# Patient Record
Sex: Female | Born: 1975 | Race: Black or African American | Hispanic: No | Marital: Single | Smoking: Never smoker
Health system: Southern US, Community
[De-identification: ages and names within clinical notes are randomized; demographics above are authoritative.]

## PROBLEM LIST (undated history)

## (undated) HISTORY — PX: GASTRIC BYPASS: SHX52

---

## 2020-07-18 ENCOUNTER — Emergency Department (HOSPITAL_COMMUNITY)
Admission: EM | Admit: 2020-07-18 | Discharge: 2020-07-18 | Disposition: A | Discharge status: Home / Self Care | Payer: Self-pay | Attending: Emergency Medicine | Admitting: Emergency Medicine

## 2020-07-18 ENCOUNTER — Emergency Department (HOSPITAL_COMMUNITY): Payer: Self-pay

## 2020-07-18 ENCOUNTER — Other Ambulatory Visit: Payer: Self-pay

## 2020-07-18 ENCOUNTER — Encounter (HOSPITAL_COMMUNITY): Payer: Self-pay

## 2020-07-18 DIAGNOSIS — K118 Other diseases of salivary glands: Secondary | ICD-10-CM

## 2020-07-18 DIAGNOSIS — R221 Localized swelling, mass and lump, neck: Secondary | ICD-10-CM | POA: Insufficient documentation

## 2020-07-18 DIAGNOSIS — D649 Anemia, unspecified: Secondary | ICD-10-CM

## 2020-07-18 LAB — CBC WITH DIFFERENTIAL/PLATELET
Abs Immature Granulocytes: 0.01 10*3/uL (ref 0.00–0.07)
Basophils Absolute: 0 10*3/uL (ref 0.0–0.1)
Basophils Relative: 1 %
Eosinophils Absolute: 0.1 10*3/uL (ref 0.0–0.5)
Eosinophils Relative: 2 %
HCT: 27.3 % — ABNORMAL LOW (ref 36.0–46.0)
Hemoglobin: 7.4 g/dL — ABNORMAL LOW (ref 12.0–15.0)
Immature Granulocytes: 0 %
Lymphocytes Relative: 38 %
Lymphs Abs: 1.8 10*3/uL (ref 0.7–4.0)
MCH: 18.1 pg — ABNORMAL LOW (ref 26.0–34.0)
MCHC: 27.1 g/dL — ABNORMAL LOW (ref 30.0–36.0)
MCV: 66.9 fL — ABNORMAL LOW (ref 80.0–100.0)
Monocytes Absolute: 0.4 10*3/uL (ref 0.1–1.0)
Monocytes Relative: 8 %
Neutro Abs: 2.4 10*3/uL (ref 1.7–7.7)
Neutrophils Relative %: 51 %
Platelets: 289 10*3/uL (ref 150–400)
RBC: 4.08 MIL/uL (ref 3.87–5.11)
RDW: 19.5 % — ABNORMAL HIGH (ref 11.5–15.5)
WBC: 4.7 10*3/uL (ref 4.0–10.5)
nRBC: 0 % (ref 0.0–0.2)

## 2020-07-18 LAB — BASIC METABOLIC PANEL
Anion gap: 5 (ref 5–15)
BUN: 10 mg/dL (ref 6–20)
CO2: 24 mmol/L (ref 22–32)
Calcium: 8.5 mg/dL — ABNORMAL LOW (ref 8.9–10.3)
Chloride: 107 mmol/L (ref 98–111)
Creatinine, Ser: 0.43 mg/dL — ABNORMAL LOW (ref 0.44–1.00)
GFR, Estimated: >60 mL/min (ref >60)
Glucose, Bld: 93 mg/dL (ref 70–99)
Potassium: 3.8 mmol/L (ref 3.5–5.1)
Sodium: 136 mmol/L (ref 135–145)

## 2020-07-18 LAB — TSH: TSH: 0.837 u[IU]/mL (ref 0.350–4.500)

## 2020-07-18 MED ORDER — FERROUS SULFATE 325 (65 FE) MG PO TABS: 325.0000 mg | ORAL_TABLET | Freq: Every day | ORAL | 0 refills | Status: AC

## 2020-07-18 NOTE — ED Provider Notes (Signed)
  Physical Exam  BP 131/76   Pulse 70   Temp 98.3 F (36.8 C) (Oral)   Resp 18   Ht 5\' 3"  (1.6 m)   Wt 120.1 kg   LMP 07/04/2020 (Approximate)   SpO2 99%   BMI 46.91 kg/m   Physical Exam  ED Course/Procedures     Procedures  MDM  Care assumed at 4 PM.  Patient has a swollen area in the neck.  Signout pending ultrasound of the thyroid and TSH and basic blood work  8:20 PM CBC showed hemoglobin 7.4.  Patient states that she has history of anemia has not been checked recently.  Denies any heavy menstrual bleeding or rectal bleeding.  Ultrasound did not show any thyroid mass but just enlarged lymph nodes.  I examined her tongue and I do not see any obvious tongue mass.  Patient does not have any obvious periapical abscess or tooth infection.  At this point, I recommend that she follow-up with ENT for biopsy of the submandibular mass.     09/03/2020, MD 07/18/20 2021

## 2020-07-18 NOTE — Discharge Instructions (Addendum)
You have a mass of your neck.  You can either go to Dr. Lucky Rathke clinic to get a biopsy or call [phone] to be seen in the ENT clinic.  You have anemia and that needs to be rechecked in the week.  Take iron pill as prescribed  Return to ER if you have worse neck swelling, trouble swallowing, tongue swelling.

## 2020-07-18 NOTE — ED Provider Notes (Signed)
[facility]-EMERGENCY DEPT Provider Note   CSN: [id number] Arrival date & time: 07/18/20  1513     History Chief Complaint  Patient presents with  . swollen area    April Sharp is a 45 y.o. female.  Presents to ER with concern for swelling on her neck.  Patient reports that she does not have a primary care doctor, does not see physician regularly.  States that just today she noted mild swelling on the central part of her anterior neck under her mouth reports this is painful, does not have any associated fever or chills.  No redness.  She denies any personal history of cancer, denies any chronic medical problems.  Has not taken any medication for this condition.  HPI     History reviewed. No pertinent past medical history.  There are no problems to display for this patient.   Past Surgical History:  Procedure Laterality Date  . GASTRIC BYPASS       OB History   No obstetric history on file.     Family History  Problem Relation Age of Onset  . Cirrhosis Father     Social History   Tobacco Use  . Smoking status: Never Smoker  . Smokeless tobacco: Never Used  Vaping Use  . Vaping Use: Never used  Substance Use Topics  . Alcohol use: Never  . Drug use: Never    Home Medications Prior to Admission medications   Not on File    Allergies    Patient has no known allergies.  Review of Systems   Review of Systems  Constitutional: Negative for chills and fever.  HENT: Negative for ear pain and sore throat.   Eyes: Negative for pain and visual disturbance.  Respiratory: Negative for cough and shortness of breath.   Cardiovascular: Negative for chest pain and palpitations.  Gastrointestinal: Negative for abdominal pain and vomiting.  Genitourinary: Negative for dysuria and hematuria.  Musculoskeletal: Negative for arthralgias, back pain and neck pain.  Skin: Negative for color change and rash.  Neurological: Negative for seizures and  syncope.  All other systems reviewed and are negative.   Physical Exam Updated Vital Signs BP 133/72 (BP Location: Left Arm)   Pulse 79   Temp 98.3 F (36.8 C) (Oral)   Resp 16   Ht 5\' 3"  (1.6 m)   Wt 120.1 kg   LMP 07/04/2020 (Approximate)   SpO2 100%   BMI 46.91 kg/m   Physical Exam Vitals and nursing note reviewed.  Constitutional:      General: She is not in acute distress.    Appearance: She is well-developed.  HENT:     Head: Normocephalic and atraumatic.     Mouth/Throat:     Comments: There is slightly firm 1-2cm diameter area of swelling in the submandibular space over the anterior neck, no overlying erythema, no fluctuance or induration on skin Eyes:     Conjunctiva/sclera: Conjunctivae normal.  Cardiovascular:     Rate and Rhythm: Normal rate.     Pulses: Normal pulses.  Pulmonary:     Effort: Pulmonary effort is normal. No respiratory distress.  Musculoskeletal:     Cervical back: Neck supple.  Skin:    General: Skin is warm and dry.  Neurological:     Mental Status: She is alert.  Psychiatric:        Mood and Affect: Mood normal.        Behavior: Behavior normal.     ED Results /  Procedures / Treatments   Labs (all labs ordered are listed, but only abnormal results are displayed) Labs Reviewed  CBC WITH DIFFERENTIAL/PLATELET  BASIC METABOLIC PANEL  TSH    EKG None  Radiology No results found.  Procedures Procedures   Medications Ordered in ED Medications - No data to display  ED Course  I have reviewed the triage vital signs and the nursing notes.  Pertinent labs & imaging results that were available during my care of the patient were reviewed by me and considered in my medical decision making (see chart for details).    MDM Rules/Calculators/A&P                          45 year old lady presents to ER with concern for neck swelling.  On exam she had a small area of swelling just inferior to her submandibular space over her  anterior neck.  No erythema, fluctuance or induration to suggest infection.  May be a lymph node.  Will check basic labs and thyroid ultrasound.  At time of signout, work-up pending.  Dr. Silverio Lay will follow up on results.   Final Clinical Impression(s) / ED Diagnoses Final diagnoses:  Neck swelling    Rx / DC Orders ED Discharge Orders    None       Milagros Loll, MD 07/19/20 2135

## 2020-07-18 NOTE — ED Triage Notes (Signed)
Patient noted a swollen area under her chin yesterday. Patient denies any swallowing or breathing difficulties

## 2020-07-18 NOTE — ED Notes (Signed)
An After Visit Summary was printed and given to the patient. Discharge instructions given and no further questions at this time.  

## 2020-11-14 ENCOUNTER — Other Ambulatory Visit: Payer: Self-pay

## 2020-11-14 ENCOUNTER — Encounter (HOSPITAL_COMMUNITY): Payer: Self-pay

## 2020-11-14 ENCOUNTER — Emergency Department (HOSPITAL_COMMUNITY)
Admission: EM | Admit: 2020-11-14 | Discharge: 2020-11-14 | Disposition: A | Discharge status: Home / Self Care | Payer: Self-pay | Attending: Emergency Medicine | Admitting: Emergency Medicine

## 2020-11-14 DIAGNOSIS — U071 COVID-19: Secondary | ICD-10-CM | POA: Insufficient documentation

## 2020-11-14 DIAGNOSIS — J069 Acute upper respiratory infection, unspecified: Secondary | ICD-10-CM

## 2020-11-14 LAB — RESP PANEL BY RT-PCR (FLU A&B, COVID) ARPGX2
Influenza A by PCR: NEGATIVE
Influenza B by PCR: NEGATIVE
SARS Coronavirus 2 by RT PCR: POSITIVE — AB

## 2020-11-14 MED ORDER — ACETAMINOPHEN 325 MG PO TABS
650.0000 mg | ORAL_TABLET | Freq: Once | ORAL | Status: AC
  Administered 2020-11-14: 650 mg via ORAL
  Filled 2020-11-14: qty 2

## 2020-11-14 NOTE — ED Triage Notes (Signed)
Pt arrived via POV, c/o chills, nasal congestion, sore throat, and cough since last night. No known sick contacts.

## 2020-11-14 NOTE — Discharge Instructions (Addendum)
You were seen in the ER today for your congestion, cough, and fatigue. You likely have a viral upper respiratory infection.   You have been tested for COVID and the flu. You may follow these results in your mychart app.   You may use over the counter medications for your symptoms.   Return to the ER with any difficulty breathing, chest pain, nausea or vomitting that does not stop, or any other new severe symptoms.

## 2020-11-14 NOTE — ED Provider Notes (Signed)
[facility]-EMERGENCY DEPT Provider Note   CSN: [id number] Arrival date & time: 11/14/20  1247     History Chief Complaint  Patient presents with   Chills    April Sharp is a 45 y.o. female who presents with concern for 24 hours of chills, nasal congestion, sore throat, and dry cough since last night.  Patient is not vaccinated against COVID-19 denies any known sick contacts.  Denies any chest pain, shortness of breath, palpitations, or abdominal symptoms.  She denies any fevers at home.  I have personally reviewed this patient's medical records.  She is history of gastric bypass but is not on any medications every day.  HPI     History reviewed. No pertinent past medical history.  There are no problems to display for this patient.   Past Surgical History:  Procedure Laterality Date   GASTRIC BYPASS       OB History   No obstetric history on file.     Family History  Problem Relation Age of Onset   Cirrhosis Father     Social History   Tobacco Use   Smoking status: Never   Smokeless tobacco: Never  Vaping Use   Vaping Use: Never used  Substance Use Topics   Alcohol use: Never   Drug use: Never    Home Medications Prior to Admission medications   Medication Sig Start Date End Date Taking? Authorizing Provider  ferrous sulfate 325 (65 FE) MG tablet Take 1 tablet (325 mg total) by mouth daily. 07/18/20   Charlynne Pander, MD    Allergies    Patient has no known allergies.  Review of Systems   Review of Systems  Constitutional:  Positive for activity change, appetite change, chills and fatigue.  HENT:  Positive for congestion and sore throat. Negative for trouble swallowing and voice change.   Eyes: Negative.   Respiratory:  Positive for cough. Negative for shortness of breath.   Cardiovascular: Negative.   Gastrointestinal: Negative.   Genitourinary: Negative.   Musculoskeletal:  Positive for myalgias. Negative for arthralgias.   Skin: Negative.   Neurological:  Positive for headaches. Negative for dizziness, syncope, weakness and light-headedness.   Physical Exam Updated Vital Signs BP 118/72 (BP Location: Left Arm)   Pulse (!) 105   Temp 99.8 F (37.7 C) (Oral)   Resp 16   LMP 11/14/2020   SpO2 100%   Physical Exam Vitals and nursing note reviewed.  Constitutional:      Appearance: She is obese. She is not ill-appearing or toxic-appearing.  HENT:     Head: Normocephalic and atraumatic.     Nose: Congestion present.     Mouth/Throat:     Mouth: Mucous membranes are moist.     Pharynx: Oropharynx is clear. Uvula midline. No oropharyngeal exudate or posterior oropharyngeal erythema.     Tonsils: No tonsillar exudate.  Eyes:     General: Lids are normal. Vision grossly intact.        Right eye: No discharge.        Left eye: No discharge.     Extraocular Movements: Extraocular movements intact.     Conjunctiva/sclera: Conjunctivae normal.     Pupils: Pupils are equal, round, and reactive to light.  Neck:     Trachea: Trachea and phonation normal.     Meningeal: Brudzinski's sign and Kernig's sign absent.  Cardiovascular:     Rate and Rhythm: Normal rate and regular rhythm.     Pulses:  Normal pulses.     Heart sounds: Normal heart sounds. No murmur heard. Pulmonary:     Effort: Pulmonary effort is normal. No tachypnea, accessory muscle usage, prolonged expiration, respiratory distress or retractions.     Breath sounds: Normal breath sounds. No wheezing or rales.  Chest:     Chest wall: No mass, tenderness, crepitus or edema.  Abdominal:     General: Bowel sounds are normal. There is no distension.     Palpations: Abdomen is soft.     Tenderness: There is no abdominal tenderness. There is no right CVA tenderness, left CVA tenderness, guarding or rebound.  Musculoskeletal:        General: No deformity.     Cervical back: Normal range of motion and neck supple. No edema, rigidity or crepitus. No  pain with movement, spinous process tenderness or muscular tenderness.     Right lower leg: No edema.     Left lower leg: No edema.  Lymphadenopathy:     Cervical: Cervical adenopathy present.     Right cervical: Superficial cervical adenopathy present.  Skin:    General: Skin is warm and dry.     Capillary Refill: Capillary refill takes less than 2 seconds.     Findings: Rash present.  Neurological:     General: No focal deficit present.     Mental Status: She is alert and oriented to person, place, and time. Mental status is at baseline.     Gait: Gait is intact.  Psychiatric:        Mood and Affect: Mood normal.    ED Results / Procedures / Treatments   Labs (all labs ordered are listed, but only abnormal results are displayed) Labs Reviewed  RESP PANEL BY RT-PCR (FLU A&B, COVID) ARPGX2    EKG None  Radiology No results found.  Procedures Procedures   Medications Ordered in ED Medications - No data to display  ED Course  I have reviewed the triage vital signs and the nursing notes.  Pertinent labs & imaging results that were available during my care of the patient were reviewed by me and considered in my medical decision making (see chart for details).    MDM Rules/Calculators/A&P                         57 old female presents with 24 hours of chills, fatigue, congestion, and sore throat with dry cough.  Differential diagnosis is broad and includes but is not limited to COVID-19, influenza A/B, other acute viral URI, strep pharyngitis, other bacterial pharyngitis such as gonococcal pharyngitis, pneumonia, pleural effusion, PE, ACS.  Patient was very mildly tachycardic on intake to 105.  Vital signs were otherwise normal.  She did become febrile while in the emergency department with T-max in the ED of 101.4.  Cardiopulmonary exam is normal, abdominal exam is benign.  HEENT exam revealed nasal congestion but was otherwise unremarkable.  She does have some shotty  anterior cervical lymphadenopathy bilaterally but does not have any rashes and is neurovascularly intact in all 4 extremities.  Patient is ambulatory in the ED and tolerating p.o.  Given reassuring vital signs and physical exam, do not feel any further work-up is warranted in the ED at this time.  Patient does have respiratory panel pending at this time.  Concern for COVID-19 infection given lack of vaccination.  Directed patient to return to the emergency department or contact the health department or her primary care  doctor should she test positive for COVID-19 and desire antiviral treatment in the outpatient setting.  Patient did remain febrile at time of discharge 100.9, however was administered Tylenol.  Given reassuring vital signs otherwise, she remained stable for discharge at this time.  April Sharp voiced understanding of her medical evaluation and treatment plan.  Each of her questions was answered to her expressed satisfaction.  Return precautions are given.  Patient is well-appearing, stable, and appropriate for discharge at this time.  This chart was dictated using voice recognition software, Dragon. Despite the best efforts of this provider to proofread and correct errors, errors may still occur which can change documentation meaning.   April Sharp was evaluated in Emergency Department on 11/16/2020 for the symptoms described in the history of present illness. She was evaluated in the context of the global COVID-19 pandemic, which necessitated consideration that the patient might be at risk for infection with the SARS-CoV-2 virus that causes COVID-19. Institutional protocols and algorithms that pertain to the evaluation of patients at risk for COVID-19 are in a state of rapid change based on information released by regulatory bodies including the CDC and federal and state organizations. These policies and algorithms were followed during the patient's care in the ED.   Final Clinical Impression(s)  / ED Diagnoses Final diagnoses:  Upper respiratory tract infection, unspecified type    Rx / DC Orders ED Discharge Orders     None        Sherrilee Gilles 11/16/20 2214    Melene Plan, DO 11/16/20 2233

## 2021-12-03 IMAGING — US US THYROID
1 series · 13 of 25 positions shown · non-contrast
Comparison: None.

CLINICAL DATA: Submandibular swelling

EXAM:
THYROID ULTRASOUND
TECHNIQUE: Ultrasound examination of the thyroid gland and adjacent soft
tissues was performed.

[Series 1: us thyroid · 13 of 58 slices shown]
[im 1/58]
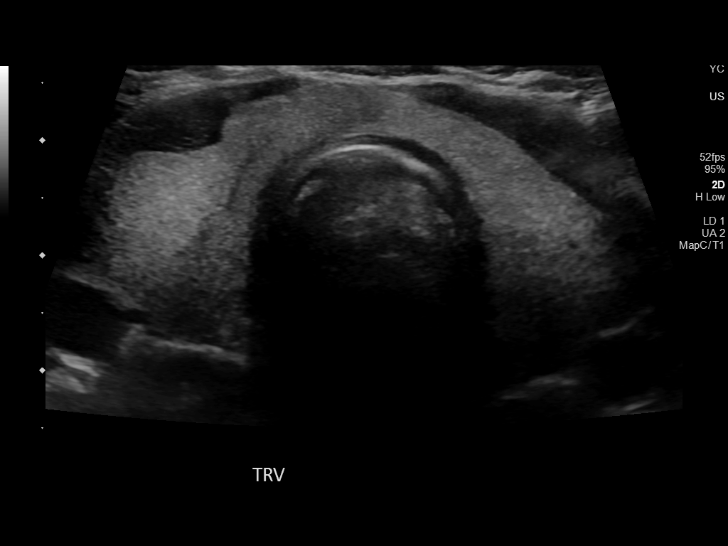
[im 5/58]
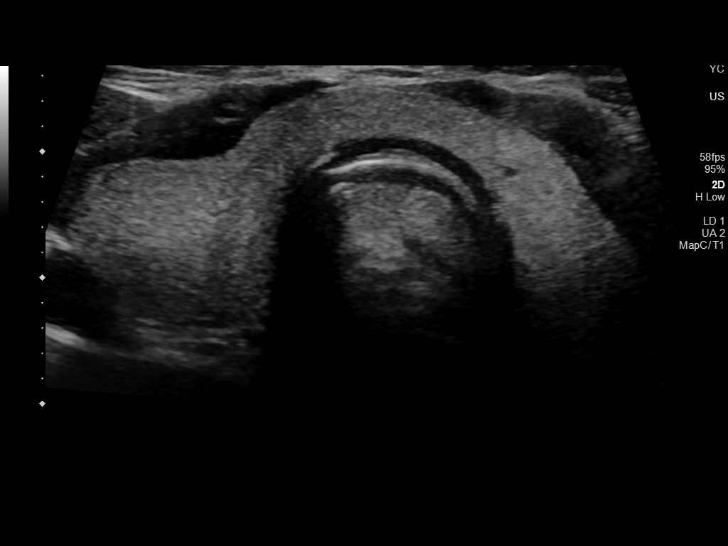
[im 10/58]
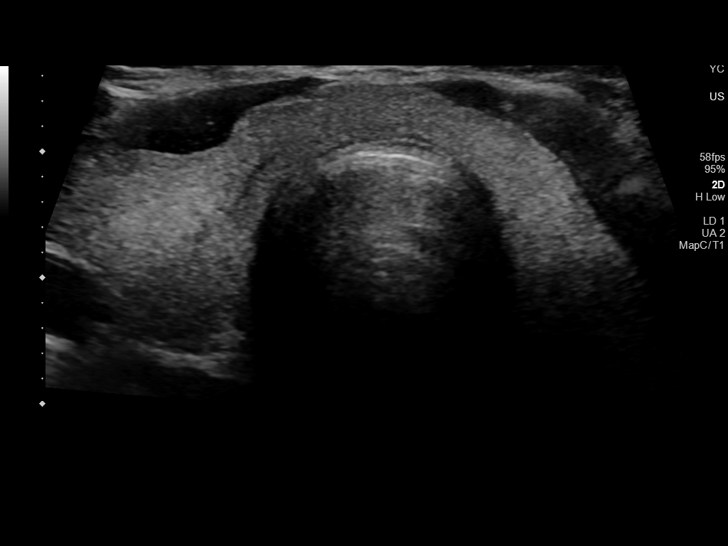
[im 15/58]
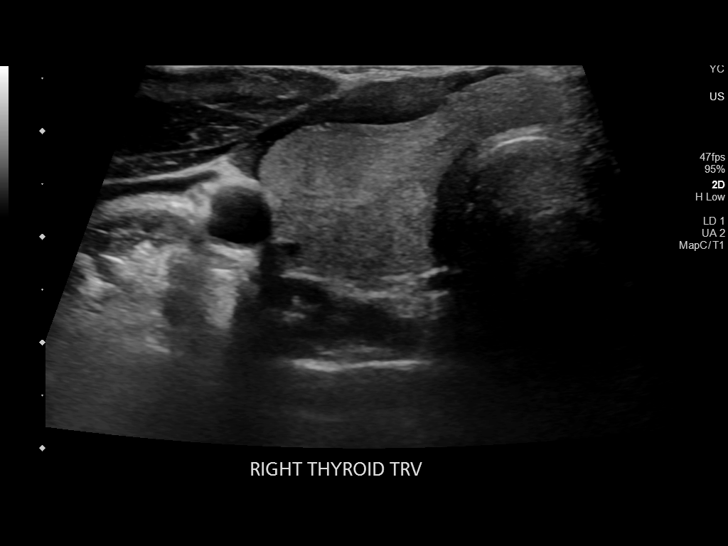
[im 20/58]
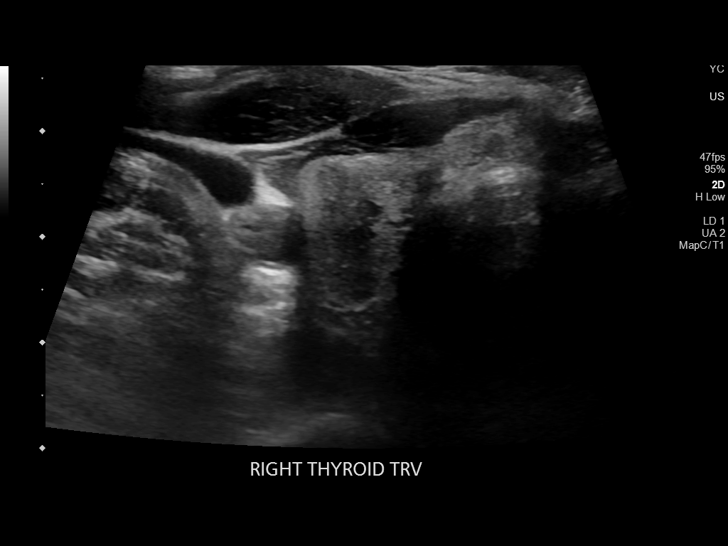
[im 24/58]
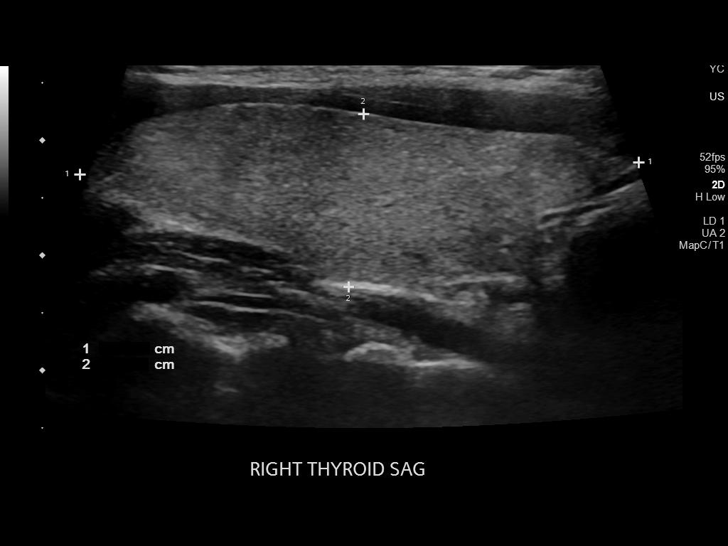
[im 29/58]
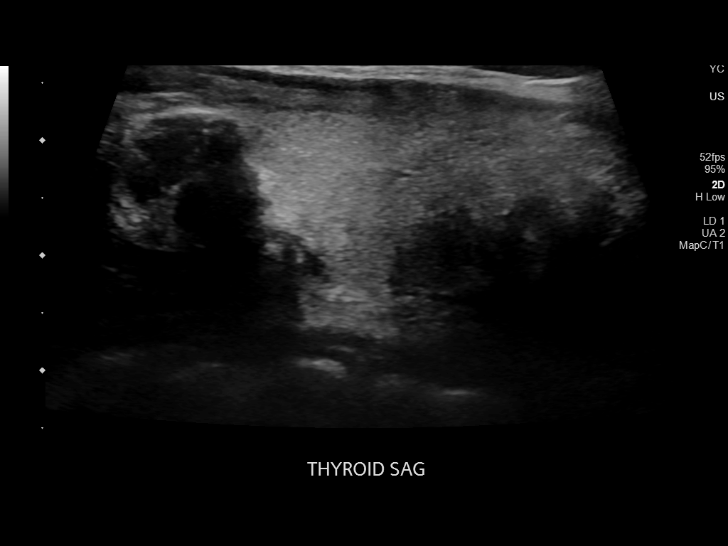
[im 34/58]
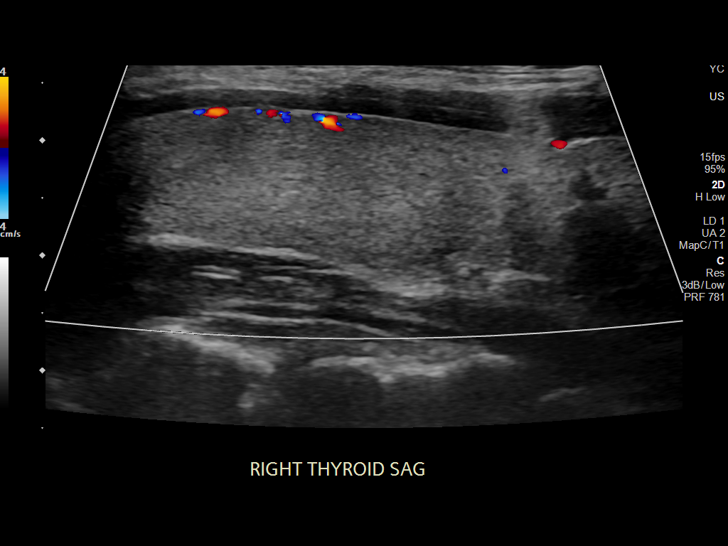
[im 39/58]
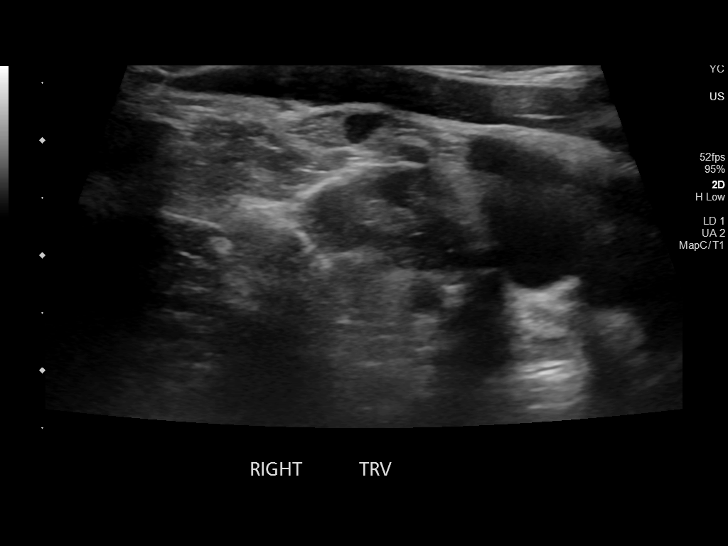
[im 43/58]
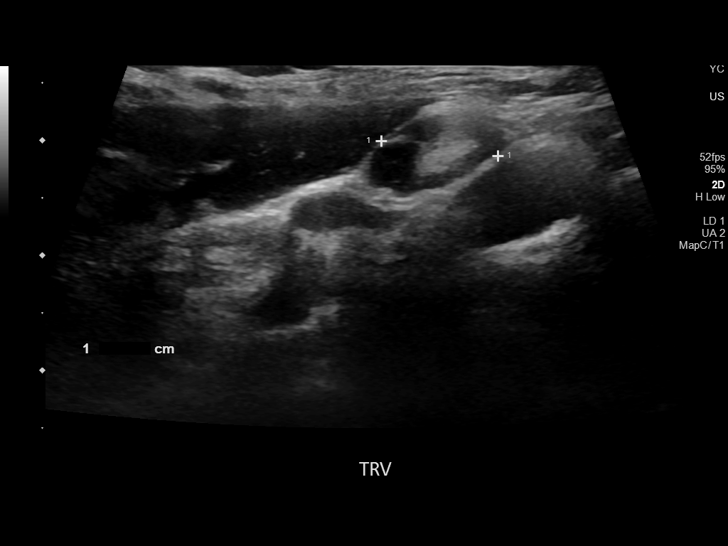
[im 48/58]
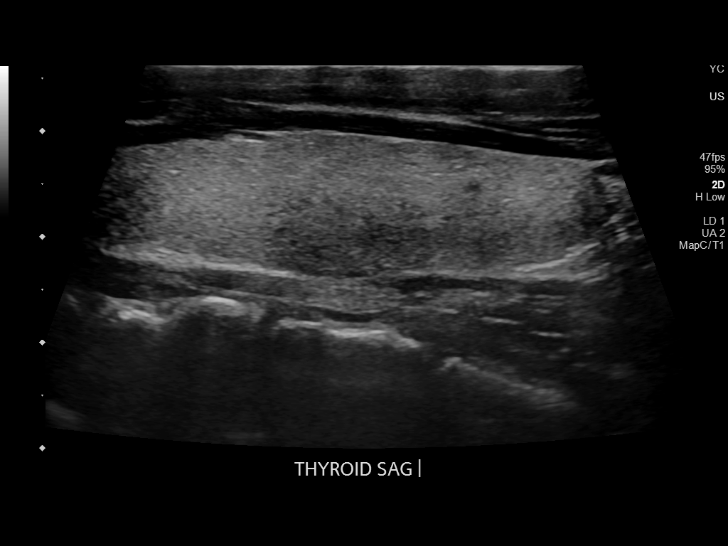
[im 53/58]
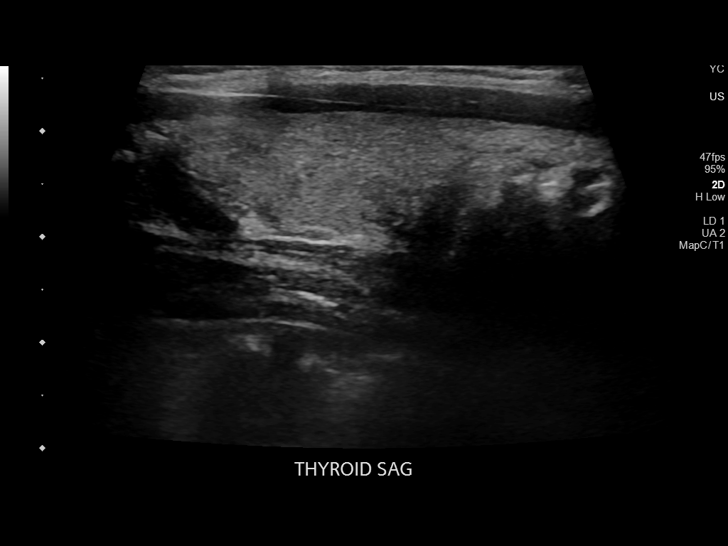
[im 58/58]
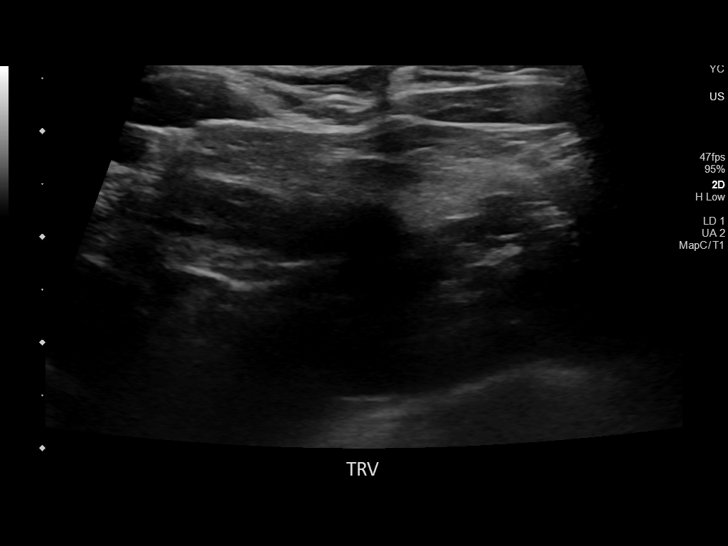

[13 of 25 positions shown; findings below may reference images not displayed]

FINDINGS: Parenchymal Echotexture: Homogeneous

Isthmus: 0.4 cm

Right lobe: 4.9 x 1.5 x 1.8 cm.

Left lobe: 5.1 x 1.4 x 1.8 cm.

_________________________________________________________

Estimated total number of nodules >/= 1 cm: 0

Number of spongiform nodules >/=  2 cm not described below (TR1): 0

Number of mixed cystic and solid nodules >/= 1.5 cm not described
below (TR2): 0

_________________________________________________________

No discrete nodules are seen within the thyroid gland.

A few lymph nodes are identified which measure up to 6 mm in short
axis with normal fatty hila felt to be within normal limits. These
are noted primarily on the right. It is uncertain whether these
correspond to the area of clinical abnormality. No other focal soft
tissue abnormality is seen.
IMPRESSION: Normal appearing thyroid without evidence of thyroid nodules.

There are a few scattered lymph nodes in the right neck which
demonstrate normal fatty hila and are not felt to be abnormal in
nature. It is uncertain whether these correspond to the area of
clinical abnormality given the patient's history

The above is in keeping with the ACR TI-RADS recommendations - [HOSPITAL] 2940;[DATE].
